# Patient Record
Sex: Male | Born: 2005 | Race: White | Hispanic: No | Marital: Single | Smoking: Never smoker
Health system: Southern US, Community
[De-identification: ages and names within clinical notes are randomized; demographics above are authoritative.]

## PROBLEM LIST (undated history)

## (undated) DIAGNOSIS — E230 Hypopituitarism: Secondary | ICD-10-CM

## (undated) DIAGNOSIS — F909 Attention-deficit hyperactivity disorder, unspecified type: Secondary | ICD-10-CM

## (undated) HISTORY — PX: TONSILLECTOMY: SUR1361

---
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
## 2023-06-26 ENCOUNTER — Emergency Department
Admission: EM | Admit: 2023-06-26 | Discharge: 2023-06-26 | Disposition: A | Discharge status: Home / Self Care | Attending: Emergency Medicine | Admitting: Emergency Medicine

## 2023-06-26 ENCOUNTER — Encounter: Payer: Self-pay | Admitting: *Deleted

## 2023-06-26 ENCOUNTER — Other Ambulatory Visit: Payer: Self-pay

## 2023-06-26 DIAGNOSIS — M778 Other enthesopathies, not elsewhere classified: Secondary | ICD-10-CM | POA: Insufficient documentation

## 2023-06-26 DIAGNOSIS — M542 Cervicalgia: Secondary | ICD-10-CM | POA: Diagnosis present

## 2023-06-26 DIAGNOSIS — M62838 Other muscle spasm: Secondary | ICD-10-CM | POA: Insufficient documentation

## 2023-06-26 MED ORDER — CYCLOBENZAPRINE HCL 10 MG PO TABS: 10.0000 mg | ORAL_TABLET | Freq: Three times a day (TID) | ORAL | 0 refills | Status: AC | PRN

## 2023-06-26 MED ORDER — MELOXICAM 15 MG PO TABS: 15.0000 mg | ORAL_TABLET | Freq: Every day | ORAL | 0 refills | Status: AC

## 2023-06-26 NOTE — Discharge Instructions (Signed)
 You have been diagnosed with trapezius muscle spasm, elbow tendinitis.  Please take Flexeril 1 tablet by mouth every 8 hours after main meals.  If you prefer you can take 1 before bed and check if you feel drowsy drowsy.  During the mornings you can take meloxicam 1 daily with breakfast.

## 2023-06-26 NOTE — ED Triage Notes (Signed)
 Pt ambulatory to triage.  Pt reports he was doing head banging last night at a rock concert and today has neck pain.  Pt did not fall.  Pt alert  speech clear.

## 2023-06-26 NOTE — ED Provider Notes (Signed)
 [facility] Provider Note    Event Date/Time   First MD Initiated Contact with Patient 06/26/23 1543     (approximate)   History   Neck Pain    HPI  Randall Soto is a 18 y.o. male    with a past medical history of respiratory tract infection, paronychia, phimosis, growth hormone deficiency, autism, ADHD who presents to the ED complaining of neck pain, left elbow pain. According to the patient, he was on a rock concert last night and he was doing head banging during the concert.  Patient denies vomiting, nauseas, blurry vision, loss of consciousness, mechanical trauma.  Patient is taking trazodone at night.      Physical Exam   Triage Vital Signs: ED Triage Vitals  Encounter Vitals Group     BP 06/26/23 1539 132/72     Systolic BP Percentile --      Diastolic BP Percentile --      Pulse Rate 06/26/23 1539 90     Resp 06/26/23 1539 20     Temp 06/26/23 1539 98.3 F (36.8 C)     Temp Source 06/26/23 1539 Oral     SpO2 06/26/23 1539 100 %     Weight 06/26/23 1540 129 lb (58.5 kg)     Height 06/26/23 1540 5\' 10"  (1.778 m)     Head Circumference --      Peak Flow --      Pain Score 06/26/23 1539 5     Pain Loc --      Pain Education --      Exclude from Growth Chart --     Most recent vital signs: Vitals:   06/26/23 1539  BP: 132/72  Pulse: 90  Resp: 20  Temp: 98.3 F (36.8 C)  SpO2: 100%     Constitutional: Alert, NAD. Able to speak in complete sentences without cough or dyspnea  Eyes: Conjunctivae are normal.  Head: Atraumatic. Nose: No congestion/rhinnorhea. Mouth/Throat: Mucous membranes are moist.   Neck: Skin is intact, no ecchymosis no hematomas.  Tender to palpation at paraspinal muscles, trapezius spasmed,  painless ROM. Supple. No JVD, nodes, thyromegaly  Cardiovascular:   Good peripheral circulation.RRR no murmurs, gallops, rubs  Respiratory: Normal respiratory effort.  No retractions. Clear to auscultation bilaterally  without wheezing or crackles  Gastrointestinal: Soft and nontender.  Musculoskeletal:  no deformity Left elbow: Skin is intact, no ecchymosis no hematomas no deformities.  Tender to palpation medial epicondyle.  Flexion limited by pain.  Sensation is intact, strength 5/5, pulses positive Neurologic:  MAE spontaneously. No gross focal neurologic deficits are appreciated.  Skin:  Skin is warm, dry and intact. No rash noted. Psychiatric: Mood and affect are normal. Speech and behavior are normal.    ED Results / Procedures / Treatments   Labs (all labs ordered are listed, but only abnormal results are displayed) Labs Reviewed - No data to display   EKG     RADIOLOGY     PROCEDURES:  Critical Care performed:   Procedures   MEDICATIONS ORDERED IN ED: Medications - No data to display    IMPRESSION / MDM / ASSESSMENT AND PLAN / ED COURSE  I reviewed the triage vital signs and the nursing notes.  Differential diagnosis includes, but is not limited to, trapezius spasm, elbow tendinitis, fracture, dislocation, intracranial hemorrhage  Patient's presentation is most consistent with acute, uncomplicated illness.  Patient's diagnosis is consistent with trapezius spasm, elbow tendinitis. I did not order  x-ray, physical exam is reassuring,  I did review the patient's allergies and medications.The patient is in stable and satisfactory condition for discharge home  Patient will be discharged home with prescriptions for Flexeril, meloxicam. Patient is to follow up with PCP as needed or otherwise directed. Patient is given ED precautions to return to the ED for any worsening or new symptoms. Discussed plan of care with patient, answered all of patient's questions, Patient agreeable to plan of care. Advised patient to take medications according to the instructions on the label. Discussed possible side effects of new medications. Patient verbalized understanding.    FINAL CLINICAL  IMPRESSION(S) / ED DIAGNOSES   Final diagnoses:  Trapezius muscle spasm  Elbow tendinitis     Rx / DC Orders   ED Discharge Orders          Ordered    cyclobenzaprine (FLEXERIL) 10 MG tablet  3 times daily PRN        06/26/23 1611    meloxicam (MOBIC) 15 MG tablet  Daily        06/26/23 1611             Note:  This document was prepared using Dragon voice recognition software and may include unintentional dictation errors.   Awilda Lennox, PA-C 06/26/23 1612    Viviano Ground, MD 06/28/23 [id number]
